# Patient Record
Sex: Female | Born: 2000 | Race: White | Hispanic: No | Marital: Single | Smoking: Never smoker
Health system: Southern US, Community
[De-identification: ages and names within clinical notes are randomized; demographics above are authoritative.]

---
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
## 2019-11-02 ENCOUNTER — Ambulatory Visit: Payer: Self-pay | Attending: Internal Medicine

## 2019-11-02 DIAGNOSIS — Z23 Encounter for immunization: Secondary | ICD-10-CM

## 2019-11-02 NOTE — Progress Notes (Signed)
   Covid-19 Vaccination Clinic  Name:  CEANNA WAREING    MRN: [id number] DOB: [birth year]  11/02/2019  Ms. Schneider was observed post Covid-19 immunization for 15 minutes without incident. She was provided with Vaccine Information Sheet and instruction to access the V-Safe system.   Ms. Alemu was instructed to call 911 with any severe reactions post vaccine: Marland Kitchen Difficulty breathing  . Swelling of face and throat  . A fast heartbeat  . A bad rash all over body  . Dizziness and weakness   Immunizations Administered    Name Date Dose VIS Date Route   Pfizer COVID-19 Vaccine 11/02/2019  9:15 AM 0.3 mL 08/24/2018 Intramuscular   Manufacturer: [employer], [employer]   Lot: [id number]   NDC: [address]-5

## 2019-11-29 ENCOUNTER — Ambulatory Visit: Payer: Self-pay | Attending: Internal Medicine

## 2019-11-29 DIAGNOSIS — Z23 Encounter for immunization: Secondary | ICD-10-CM

## 2019-11-29 NOTE — Progress Notes (Signed)
° °  Covid-19 Vaccination Clinic  Name:  Christine Rollins    MRN: [id number] DOB: June 06, 2001  11/29/2019  [name] was observed post Covid-19 immunization for 15 minutes without incident. She was provided with Vaccine Information Sheet and instruction to access the V-Safe system.   [name] was instructed to call 911 with any severe reactions post vaccine:  Difficulty breathing   Swelling of face and throat   A fast heartbeat   A bad rash all over body   Dizziness and weakness   Immunizations Administered    Name Date Dose VIS Date Route   Pfizer COVID-19 Vaccine 11/29/2019  8:14 AM 0.3 mL 08/24/2018 Intramuscular   Manufacturer: [employer], [employer]   Lot: [id number]   NDC: [address]-5

## 2020-04-13 ENCOUNTER — Ambulatory Visit: Payer: Commercial Managed Care - PPO | Admitting: Obstetrics and Gynecology

## 2020-07-13 ENCOUNTER — Ambulatory Visit: Payer: Self-pay | Admitting: Adult Health

## 2020-07-27 ENCOUNTER — Ambulatory Visit: Payer: Self-pay | Admitting: Family Medicine

## 2020-07-27 ENCOUNTER — Telehealth: Payer: Self-pay

## 2020-07-27 NOTE — Telephone Encounter (Signed)
Copied from CRM [id number]. Topic: General - Other >> Jul 27, 2020  2:53 PM Gwenlyn Fudge wrote: Reason for CRM: Pt called stating that her car broke down and had to cancel her new patient appt today. Pt would like to reschedule. Please advise.
